# Patient Record
Sex: Male | Born: 1995 | Race: Black or African American | Hispanic: No | Marital: Single | Smoking: Never smoker
Health system: Southern US, Community
[De-identification: ages and names within clinical notes are randomized; demographics above are authoritative.]

---

## 2018-04-12 ENCOUNTER — Ambulatory Visit
Admission: RE | Admit: 2018-04-12 | Discharge: 2018-04-12 | Disposition: A | Discharge status: Home / Self Care | Payer: No Typology Code available for payment source | Source: Ambulatory Visit | Attending: Internal Medicine | Admitting: Internal Medicine

## 2018-04-12 ENCOUNTER — Ambulatory Visit: Payer: No Typology Code available for payment source

## 2018-04-12 ENCOUNTER — Other Ambulatory Visit: Payer: Self-pay | Admitting: Internal Medicine

## 2018-04-12 DIAGNOSIS — Z111 Encounter for screening for respiratory tuberculosis: Secondary | ICD-10-CM

## 2019-10-08 IMAGING — DX DG CHEST 1V
1 series · 1 of 1 positions shown · non-contrast
Comparison: None.

CLINICAL DATA: Positive tuberculin skin test

EXAM:
CHEST  1 VIEW

[dg chest 1 view]
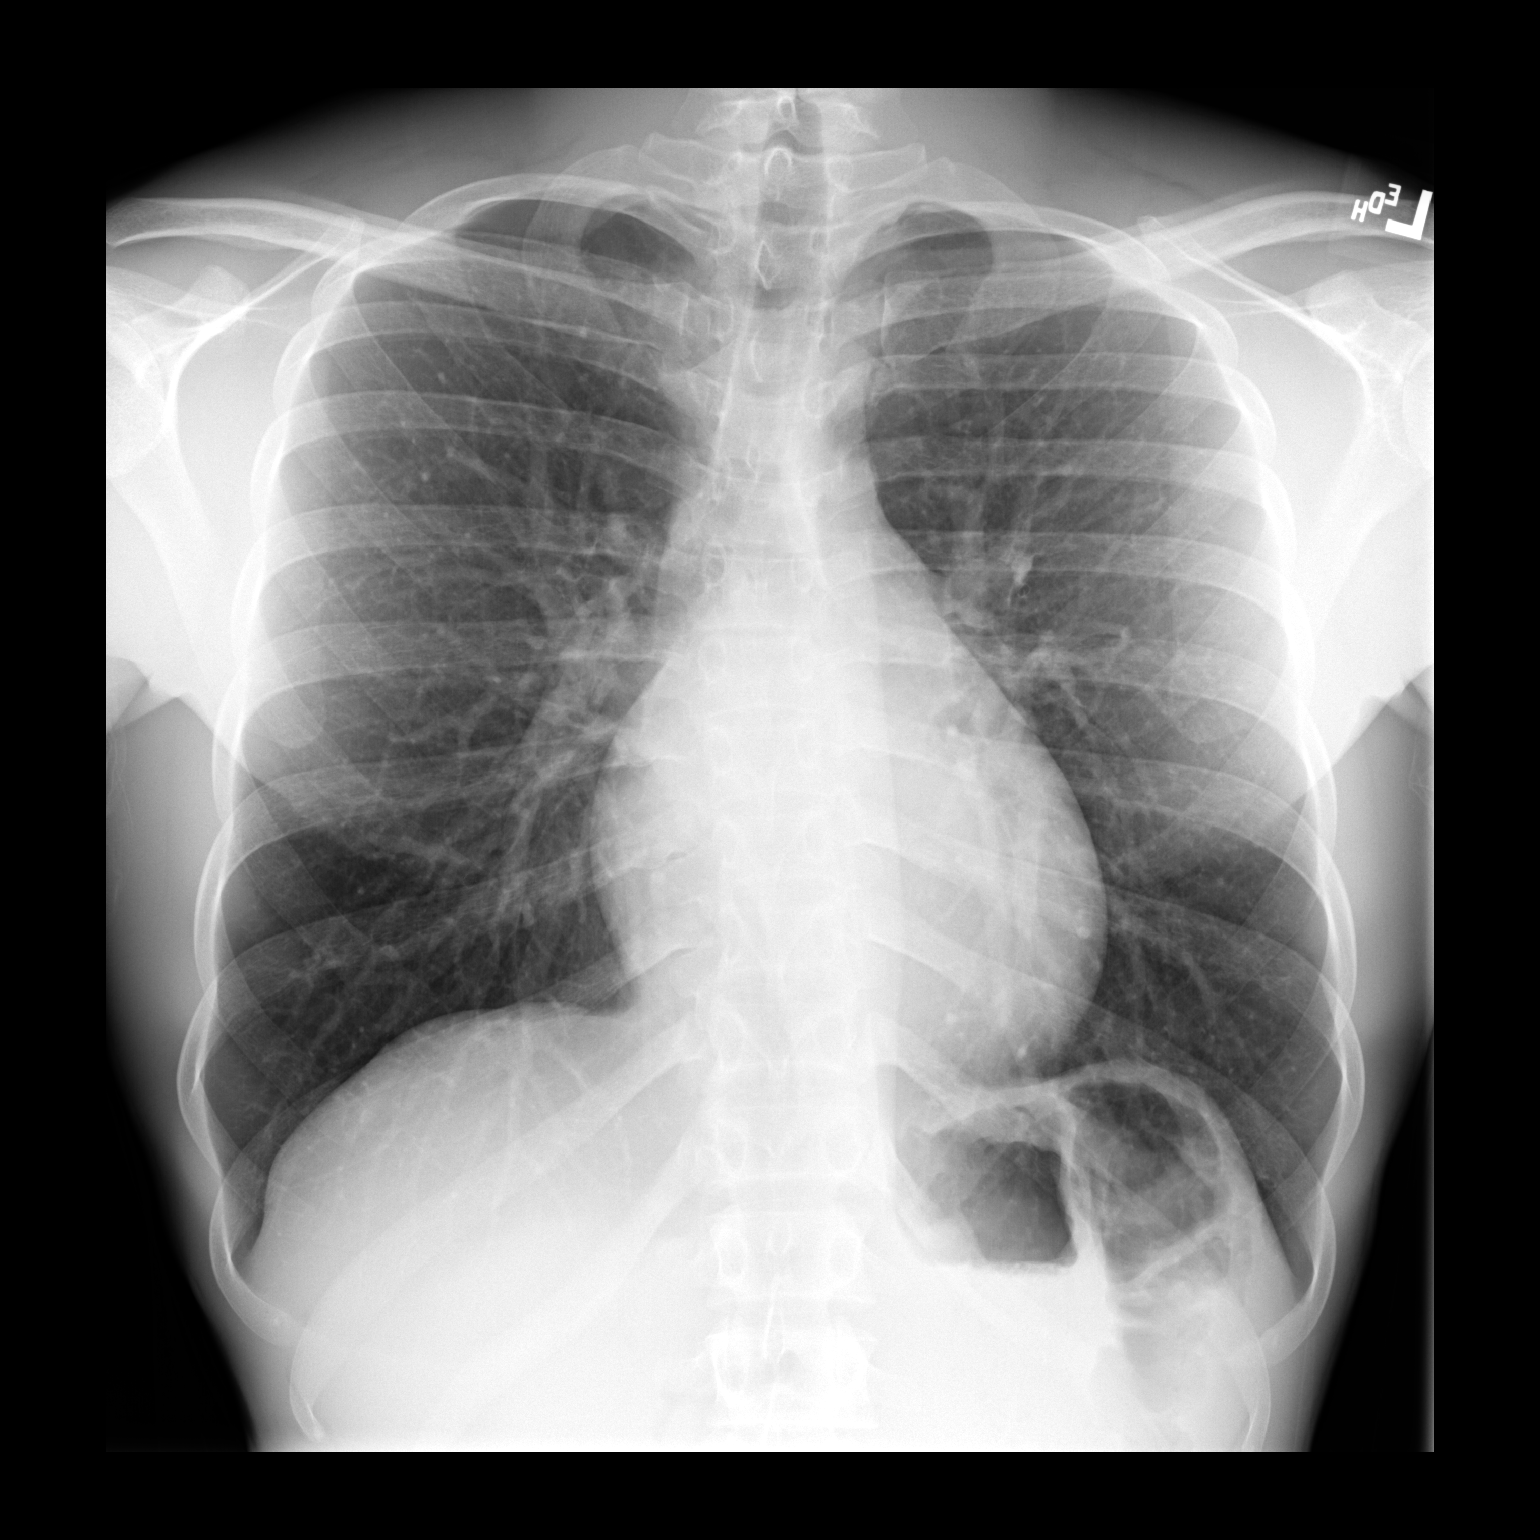

[1 of 1 positions shown; findings below may reference images not displayed]

FINDINGS: Lungs are clear. Heart size and pulmonary vascularity are normal. No
adenopathy. No bone lesions.
IMPRESSION: No abnormality noted.

## 2019-12-12 ENCOUNTER — Other Ambulatory Visit: Payer: Self-pay

## 2019-12-12 ENCOUNTER — Emergency Department
Admission: EM | Admit: 2019-12-12 | Discharge: 2019-12-13 | Disposition: A | Discharge status: Home / Self Care | Payer: Managed Care, Other (non HMO) | Attending: Student | Admitting: Student

## 2019-12-12 ENCOUNTER — Emergency Department: Payer: Managed Care, Other (non HMO)

## 2019-12-12 ENCOUNTER — Encounter: Payer: Self-pay | Admitting: Emergency Medicine

## 2019-12-12 DIAGNOSIS — R202 Paresthesia of skin: Secondary | ICD-10-CM | POA: Diagnosis not present

## 2019-12-12 DIAGNOSIS — F419 Anxiety disorder, unspecified: Secondary | ICD-10-CM

## 2019-12-12 DIAGNOSIS — R0789 Other chest pain: Secondary | ICD-10-CM | POA: Diagnosis present

## 2019-12-12 LAB — CBC
HCT: 44 % (ref 39.0–52.0)
Hemoglobin: 15.8 g/dL (ref 13.0–17.0)
MCH: 32.4 pg (ref 26.0–34.0)
MCHC: 35.9 g/dL (ref 30.0–36.0)
MCV: 90.2 fL (ref 80.0–100.0)
Platelets: 284 10*3/uL (ref 150–400)
RBC: 4.88 MIL/uL (ref 4.22–5.81)
RDW: 13.1 % (ref 11.5–15.5)
WBC: 6.1 10*3/uL (ref 4.0–10.5)
nRBC: 0 % (ref 0.0–0.2)

## 2019-12-12 LAB — BASIC METABOLIC PANEL
Anion gap: 14 (ref 5–15)
BUN: 8 mg/dL (ref 6–20)
CO2: 22 mmol/L (ref 22–32)
Calcium: 9.2 mg/dL (ref 8.9–10.3)
Chloride: 99 mmol/L (ref 98–111)
Creatinine, Ser: 0.85 mg/dL (ref 0.61–1.24)
GFR calc Af Amer: >60 mL/min (ref >60)
GFR calc non Af Amer: >60 mL/min (ref >60)
Glucose, Bld: 144 mg/dL — ABNORMAL HIGH (ref 70–99)
Potassium: 3.3 mmol/L — ABNORMAL LOW (ref 3.5–5.1)
Sodium: 135 mmol/L (ref 135–145)

## 2019-12-12 LAB — TROPONIN I (HIGH SENSITIVITY): Troponin I (High Sensitivity): 4 ng/L (ref <18)

## 2019-12-12 NOTE — ED Triage Notes (Signed)
Pt arrived via POV with c/o chest tightness that started about 30-45 minutes ago while sitting in the car.  Pt also c/o tingling above left nipple.  Pt states it feels tight when he tries to take in a deep breath.

## 2019-12-13 LAB — TROPONIN I (HIGH SENSITIVITY): Troponin I (High Sensitivity): 4 ng/L (ref <18)

## 2019-12-13 LAB — FIBRIN DERIVATIVES D-DIMER (ARMC ONLY): Fibrin derivatives D-dimer (ARMC): 199.06 ng/mL (FEU) (ref 0.00–499.00)

## 2019-12-13 NOTE — ED Provider Notes (Signed)
[facility] Emergency Department Provider Note  ____________________________________________   First MD Initiated Contact with Patient 12/13/19 0004     (approximate)  I have reviewed the triage vital signs and the nursing notes.  History  Chief Complaint Chest Pain    HPI Roger Pierce is a 24 y.o. male w/ no significant medical hx who presents to the ER for chest pain and tingling. Symptoms started about 30-45 minutes PTA while sitting in his car. He states he has had similar symptoms on and off for the last 2 years, but today he felt like they lasted for longer than normal, which prompted him to seek care.  He describes chest tightness and tingling type sensation located around his left nipple area.  Mild to moderate in severity.  This has since resolved and at present he is asymptomatic.  This was also associated with some scattered tingling to his left arm and his left leg. No weakness, facial droop, or speech difficulties.   He states the initial sensation in his chest caused him a lot of anxiety, and is wondering if the remainder of his symptoms (scattered tingling) are related to this.  He has had an anxiety attack previously and had similar symptoms as today. Symptoms worsened by anxiety and improved w/ relaxation. Reports some mild shortness of breath associated with the chest tightness.  No syncope.  No history of VTE.  No fevers, cough, sick contacts.   Past Medical Hx History reviewed. No pertinent past medical history.  Problem List There are no problems to display for this patient.   Past Surgical Hx History reviewed. No pertinent surgical history.  Medications Prior to Admission medications   Not on File    Allergies Patient has no known allergies.  Family Hx History reviewed. No pertinent family history.  Social Hx Social History   Tobacco Use  . Smoking status: Never Smoker  . Smokeless tobacco: Never Used  Vaping Use  .  Vaping Use: Never used  Substance Use Topics  . Alcohol use: Not on file  . Drug use: Not on file     Review of Systems  Constitutional: Negative for fever. Negative for chills. Eyes: Negative for visual changes. ENT: Negative for sore throat. Cardiovascular: + for chest pain. Respiratory: Negative for shortness of breath. Gastrointestinal: Negative for nausea. Negative for vomiting.  Genitourinary: Negative for dysuria. Musculoskeletal: Negative for leg swelling. Skin: Negative for rash. Neurological: Negative for headaches.   Physical Exam  Vital Signs: ED Triage Vitals  Enc Vitals Group     BP 12/12/19 2058 (!) 144/82     Pulse Rate 12/12/19 2058 91     Resp 12/12/19 2058 18     Temp 12/12/19 2058 98.9 F (37.2 C)     Temp Source 12/12/19 2058 Oral     SpO2 12/12/19 2058 100 %     Weight 12/12/19 2051 135 lb (61.2 kg)     Height 12/12/19 2051 5\' 7"  (1.702 m)     Head Circumference --      Peak Flow --      Pain Score 12/12/19 2051 6     Pain Loc --      Pain Edu? --      Excl. in GC? --     Constitutional: Alert and oriented. Well appearing. NAD.  Head: Normocephalic. Atraumatic. Eyes: Conjunctivae clear. Sclera anicteric. Pupils equal and symmetric. Nose: No masses or lesions. No congestion or rhinorrhea. Mouth/Throat: Wearing mask.  Neck: No stridor. Trachea midline.  Cardiovascular: Normal rate, regular rhythm. Extremities well perfused. Respiratory: Normal respiratory effort.  Lungs CTAB. No wheezing. No hypoxia.  Gastrointestinal: Soft. Non-distended. Non-tender.  Genitourinary: Deferred. Musculoskeletal: No lower extremity edema. No deformities. Neurologic:  Normal speech and language. No gross focal or lateralizing neurologic deficits are appreciated. Alert and oriented.  Face symmetric.  Tongue midline.  Cranial nerves II through XII intact. UE and LE strength 5/5 and symmetric. UE and LE SILT.  Skin: Skin is warm, dry and intact. No rash  noted. Psychiatric: Mood and affect are appropriate for situation.  EKG  Personally reviewed and interpreted by myself.   Date: 12/12/19 Time: 2055 Rate: 99 Rhythm: sinus Axis: rightward Intervals: WNL No acute ischemic changes No STEMI    Radiology  Personally reviewed available imaging myself.   CXR - IMPRESSION:  Bronchial thickening suggesting asthma or bronchitis.    Procedures  Procedure(s) performed (including critical care):  Procedures   Initial Impression / Assessment and Plan / MDM / ED Course  24 y.o. male who presents to the ED for atypical chest discomfort, as above  History seems most consistent with anxiety, though broad differential also includes atypical ACS, pleurisy, bronchitis electrolyte abnormality, PE.    Will plan for labs, imaging, EKG.  Patient is open to receiving information about [facility] clinic for further outpatient follow-up for discussion and management of his anxiety.  Work-up reassuring.  Some mild bronchial thickening on XR suggesting asthma or bronchitis.  Given he denies any cough, has no fever, is afebrile, does not have a leukocytosis, do not feel this represents acute infection.  Remainder of labs without actionable derangements.  Troponin and delta troponin both negative.  D-dimer negative.  EKG without acute ischemic changes, no acute arrhythmias.  Given his negative work-up, feel patient is stable for discharge with outpatient follow-up.  Suspect his symptoms are in large part related to anxiety.  Given information for [facility] and discussed return precautions.  Patient voices understanding and is comfortable with the plan and discharge.   _______________________________   As part of my medical decision making I have reviewed available labs, radiology tests, reviewed old records/performed chart review.    Final Clinical Impression(s) / ED Diagnosis  Atypical chest pain Tingling    Note:  This document was prepared using  Dragon voice recognition software and may include unintentional dictation errors.   Miguel Aschoff., MD 12/13/19 [phone]

## 2019-12-13 NOTE — Discharge Instructions (Addendum)
Thank you for letting us take care of you in the ER today. Your blood work today was reassuring, including your heart markers, and blood counts (your hemoglobin was normal at 15.8)  Continue to eat a healthy diet and exercise.   Follow up with the clinic below for further management of anxiety/stress.  Return to the ER for any new or worsening symptoms.
